# Patient Record
Sex: Female | Born: 1980 | Race: Black or African American | Hispanic: No | Marital: Single | Smoking: Current every day smoker
Health system: Southern US, Community
[De-identification: ages and names within clinical notes are randomized; demographics above are authoritative.]

## PROBLEM LIST (undated history)

## (undated) DIAGNOSIS — D571 Sickle-cell disease without crisis: Secondary | ICD-10-CM

## (undated) HISTORY — PX: CERVIX SURGERY: SHX593

---

## 2016-10-27 ENCOUNTER — Emergency Department
Admission: EM | Admit: 2016-10-27 | Discharge: 2016-10-27 | Disposition: A | Discharge status: Home / Self Care | Payer: Medicaid Other | Attending: Emergency Medicine | Admitting: Emergency Medicine

## 2016-10-27 ENCOUNTER — Encounter: Payer: Self-pay | Admitting: Emergency Medicine

## 2016-10-27 DIAGNOSIS — K0889 Other specified disorders of teeth and supporting structures: Secondary | ICD-10-CM | POA: Insufficient documentation

## 2016-10-27 DIAGNOSIS — F17200 Nicotine dependence, unspecified, uncomplicated: Secondary | ICD-10-CM | POA: Insufficient documentation

## 2016-10-27 DIAGNOSIS — M62838 Other muscle spasm: Secondary | ICD-10-CM | POA: Diagnosis not present

## 2016-10-27 DIAGNOSIS — R200 Anesthesia of skin: Secondary | ICD-10-CM | POA: Diagnosis present

## 2016-10-27 HISTORY — DX: Sickle-cell disease without crisis: D57.1

## 2016-10-27 LAB — BASIC METABOLIC PANEL
Anion gap: 6 (ref 5–15)
BUN: 8 mg/dL (ref 6–20)
CHLORIDE: 110 mmol/L (ref 101–111)
CO2: 25 mmol/L (ref 22–32)
CREATININE: 0.73 mg/dL (ref 0.44–1.00)
Calcium: 9 mg/dL (ref 8.9–10.3)
GFR calc non Af Amer: >60 mL/min (ref >60)
GLUCOSE: 85 mg/dL (ref 65–99)
Potassium: 3.7 mmol/L (ref 3.5–5.1)
Sodium: 141 mmol/L (ref 135–145)

## 2016-10-27 LAB — CBC
HEMATOCRIT: 33.6 % — AB (ref 35.0–47.0)
HEMOGLOBIN: 11.4 g/dL — AB (ref 12.0–16.0)
MCH: 30.1 pg (ref 26.0–34.0)
MCHC: 33.9 g/dL (ref 32.0–36.0)
MCV: 88.9 fL (ref 80.0–100.0)
Platelets: 302 10*3/uL (ref 150–440)
RBC: 3.78 MIL/uL — ABNORMAL LOW (ref 3.80–5.20)
RDW: 14 % (ref 11.5–14.5)
WBC: 9.4 10*3/uL (ref 3.6–11.0)

## 2016-10-27 MED ORDER — AMOXICILLIN-POT CLAVULANATE 875-125 MG PO TABS
1.0000 | ORAL_TABLET | Freq: Once | ORAL | Status: AC
  Administered 2016-10-27: 1 via ORAL
  Filled 2016-10-27: qty 1

## 2016-10-27 MED ORDER — DICLOFENAC SODIUM 3 % TD GEL: 1.0000 "application " | Freq: Two times a day (BID) | TRANSDERMAL | 0 refills | Status: DC | PRN

## 2016-10-27 MED ORDER — CARISOPRODOL 350 MG PO TABS: 350.0000 mg | ORAL_TABLET | Freq: Three times a day (TID) | ORAL | 0 refills | Status: DC | PRN

## 2016-10-27 MED ORDER — AMOXICILLIN-POT CLAVULANATE 875-125 MG PO TABS: 1.0000 | ORAL_TABLET | Freq: Two times a day (BID) | ORAL | 0 refills | Status: AC

## 2016-10-27 NOTE — ED Triage Notes (Addendum)
Pt to ed with c/o numbness in right shoulder radiating up into neck and back of head x 2 days and numbness and swelling to face.  Also c/o bilat feet and leg swelling x 3 days.  Hx of sickle cell anemia.
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
## 2016-10-27 NOTE — ED Notes (Signed)
Pt reports sharp pains in right shoulder shooting to the back of her head as well as left side facial swelling  X 3 days. Pt had recent dental work done and finished amoxicillin this morning.

## 2017-02-04 ENCOUNTER — Emergency Department
Admission: EM | Admit: 2017-02-04 | Discharge: 2017-02-04 | Disposition: A | Discharge status: Home / Self Care | Payer: Medicaid Other | Attending: Emergency Medicine | Admitting: Emergency Medicine

## 2017-02-04 ENCOUNTER — Other Ambulatory Visit: Payer: Self-pay

## 2017-02-04 ENCOUNTER — Emergency Department: Payer: Medicaid Other

## 2017-02-04 DIAGNOSIS — F17200 Nicotine dependence, unspecified, uncomplicated: Secondary | ICD-10-CM | POA: Insufficient documentation

## 2017-02-04 DIAGNOSIS — W19XXXA Unspecified fall, initial encounter: Secondary | ICD-10-CM

## 2017-02-04 DIAGNOSIS — M25531 Pain in right wrist: Secondary | ICD-10-CM | POA: Diagnosis not present

## 2017-02-04 DIAGNOSIS — M25511 Pain in right shoulder: Secondary | ICD-10-CM | POA: Diagnosis not present

## 2017-02-04 MED ORDER — MELOXICAM 15 MG PO TABS: 15.0000 mg | ORAL_TABLET | Freq: Every day | ORAL | 1 refills | Status: DC

## 2017-02-04 NOTE — ED Triage Notes (Signed)
Pt states that she tripped over son's shoes and fell onto her rt side down the stairs, pt is c/o rt sided neck pain and rt arm and wrist pain. Denies loc

## 2017-02-04 NOTE — ED Provider Notes (Signed)
[facility] Emergency Department Provider Note  ____________________________________________  Time seen: Approximately 7:16 PM  I have reviewed the triage vital signs and the nursing notes.   HISTORY  Chief Complaint Fall    HPI Laura Williams is a 36 y.o. female presents to the emergency department with right shoulder pain and right wrist pain that is 10 out of 10 in intensity after she tripped over her son's shoes and fell down stairs.  Patient denies losing consciousness.  She denies weakness, radiculopathy or changes in sensation in the upper extremities.  No skin compromise.  Patient was able to ambulate after the incident.   Past Medical History:  Diagnosis Date  . Sickle cell anemia (HCC)     There are no active problems to display for this patient.   No past surgical history on file.  Prior to Admission medications   Medication Sig Start Date End Date Taking? Authorizing Provider  carisoprodol (SOMA) 350 MG tablet Take 1 tablet (350 mg total) by mouth 3 (three) times daily as needed. 10/27/16   Schaevitz, Myra Rude, MD  Diclofenac Sodium 3 % GEL Place 1 application onto the skin every 12 (twelve) hours as needed. 10/27/16   Myrna Blazer, MD  meloxicam [facility]) 15 MG tablet Take 1 tablet (15 mg total) daily by mouth. 02/04/17 02/04/18  Orvil Feil, PA-C    Allergies Penicillins  No family history on file.  Social History Social History   Tobacco Use  . Smoking status: Current Every Day Smoker  . Smokeless tobacco: Never Used  Substance Use Topics  . Alcohol use: No  . Drug use: No     Review of Systems  Constitutional: No fever/chills Eyes: No visual changes. No discharge ENT: No upper respiratory complaints. Cardiovascular: no chest pain. Respiratory: no cough. No SOB. Gastrointestinal: No abdominal pain.  No nausea, no vomiting.  No diarrhea.  No constipation. Musculoskeletal: Patient has right wrist,  shoulder and neck pain. Skin: Negative for rash, abrasions, lacerations, ecchymosis. Neurological: Negative for headaches, focal weakness or numbness.  ____________________________________________   PHYSICAL EXAM:  VITAL SIGNS: ED Triage Vitals  Enc Vitals Group     BP 02/04/17 1815 111/68     Pulse Rate 02/04/17 1815 65     Resp 02/04/17 1815 18     Temp 02/04/17 1815 98.7 F (37.1 C)     Temp Source 02/04/17 1815 Oral     SpO2 02/04/17 1815 100 %     Weight 02/04/17 1815 162 lb (73.5 kg)     Height 02/04/17 1815 5\' 2"  (1.575 m)     Head Circumference --      Peak Flow --      Pain Score 02/04/17 1814 10     Pain Loc --      Pain Edu? --      Excl. in GC? --      Constitutional: Alert and oriented. Well appearing and in no acute distress. Eyes: Conjunctivae are normal. PERRL. EOMI. Head: Atraumatic. ENT:      Ears: TMs are pearly bilaterally without bloody effusion.      Nose: No congestion/rhinnorhea.      Mouth/Throat: Mucous membranes are moist.  Neck: No stridor.  Patient has cervical spine tenderness to palpation. Cardiovascular: Normal rate, regular rhythm. Normal S1 and S2.  Good peripheral circulation. Respiratory: Normal respiratory effort without tachypnea or retractions. Lungs CTAB. Good air entry to the bases with no decreased or absent breath sounds. Musculoskeletal:  Patient is able to perform limited range of motion at the right shoulder and right wrist, likely secondary to pain.  Patient is able to move all 5 right fingers.  Palpable radial pulse, right. Neurologic:  Normal speech and language. No gross focal neurologic deficits are appreciated.  Skin:  Skin is warm, dry and intact. No rash noted. Psychiatric: Mood and affect are normal. Speech and behavior are normal. Patient exhibits appropriate insight and judgement.   ____________________________________________   LABS (all labs ordered are listed, but only abnormal results are displayed)  Labs  Reviewed - No data to display ____________________________________________  EKG   ____________________________________________  RADIOLOGY Geraldo PitterI, Shade Kaley M Shequita Peplinski, personally viewed and evaluated these images (plain radiographs) as part of my medical decision making, as well as reviewing the written report by the radiologist.    Dg Cervical Spine 2-3 Views  Result Date: 02/04/2017 CLINICAL DATA:  Recent fall with neck pain, initial encounter EXAM: CERVICAL SPINE - 3 VIEW COMPARISON:  None. FINDINGS: Seven cervical segments are well visualized. There is loss the normal cervical lordosis likely related to muscular spasm. Calcification of the stylohyoid ligament is noted bilaterally. No acute fracture is seen. Very mild osteophytic changes are noted at C4-5 and C5-6. No soft tissue changes are noted. The odontoid is within normal limits. IMPRESSION: Mild degenerative change without acute abnormality. Electronically Signed   By: Alcide CleverMark  Lukens M.D.   On: 02/04/2017 19:57   Dg Shoulder Right  Result Date: 02/04/2017 CLINICAL DATA:  Pain after trauma. EXAM: RIGHT SHOULDER - 2+ VIEW COMPARISON:  None. FINDINGS: There is a calcification along the undersurface of the acromion which is favored to be nonacute. This would be an unusual fracture. The study is otherwise normal with no evidence of fracture or dislocation. IMPRESSION: No evidence of acute fracture.  No dislocation. Electronically Signed   By: Gerome Samavid  Williams III M.D   On: 02/04/2017 19:58   Dg Wrist Complete Right  Result Date: 02/04/2017 CLINICAL DATA:  Trip and fall with wrist pain, initial encounter EXAM: RIGHT WRIST - COMPLETE 3+ VIEW COMPARISON:  None. FINDINGS: There is no evidence of fracture or dislocation. There is no evidence of arthropathy or other focal bone abnormality. Soft tissues are unremarkable. IMPRESSION: No acute abnormality noted. Electronically Signed   By: Alcide CleverMark  Lukens M.D.   On: 02/04/2017 19:56     ____________________________________________    PROCEDURES  Procedure(s) performed:    Procedures    Medications - No data to display   ____________________________________________   INITIAL IMPRESSION / ASSESSMENT AND PLAN / ED COURSE  Pertinent labs & imaging results that were available during my care of the patient were reviewed by me and considered in my medical decision making (see chart for details).  Review of the [place] CSRS was performed in accordance of the [facility] prior to dispensing any controlled drugs.    Assessment and plan Right upper extremity pain Patient presents to the emergency department with right shoulder and right wrist pain after a fall.  Differential diagnosis includes contusion versus fracture versus laceration.  No skin compromise is identified on physical exam.  Neurologic exam and overall physical exam is reassuring.  X-ray examination conducted in the emergency department revealed no acute fractures or bony abnormalities.  Patient was discharged with meloxicam and advised to follow-up with orthopedics as needed.  Vital signs are reassuring prior to discharge.  All patient questions were answered.     ____________________________________________  FINAL CLINICAL IMPRESSION(S) / ED DIAGNOSES  Final diagnoses:  Fall, initial encounter      NEW MEDICATIONS STARTED DURING THIS VISIT:  ED Discharge Orders        Ordered    meloxicam (MOBIC) 15 MG tablet  Daily     02/04/17 2030          This chart was dictated using voice recognition software/Dragon. Despite best efforts to proofread, errors can occur which can change the meaning. Any change was purely unintentional.    Gasper LloydWoods, Geoge Lawrance M, PA-C 02/04/17 2117    Sharman CheekStafford, Phillip, MD 02/06/17 [phone]

## 2017-02-04 NOTE — ED Notes (Signed)
Reviewed d/c instructions, follow-up care, prescription, use of ice/elevation with patient. Patient verbalized understanding

## 2017-02-04 NOTE — ED Triage Notes (Signed)
First Nurse Note:  Arrives c/o fall and right wrist injury.  Right wrist with +1 swelling.  + radial pulse and brisk capillary refill noted.  NAD.

## 2017-02-17 ENCOUNTER — Encounter (HOSPITAL_COMMUNITY): Payer: Self-pay | Admitting: *Deleted

## 2017-02-17 ENCOUNTER — Emergency Department (HOSPITAL_COMMUNITY)
Admission: EM | Admit: 2017-02-17 | Discharge: 2017-02-17 | Disposition: A | Discharge status: Home / Self Care | Payer: Medicaid Other | Attending: Emergency Medicine | Admitting: Emergency Medicine

## 2017-02-17 ENCOUNTER — Other Ambulatory Visit: Payer: Self-pay

## 2017-02-17 DIAGNOSIS — F17200 Nicotine dependence, unspecified, uncomplicated: Secondary | ICD-10-CM | POA: Insufficient documentation

## 2017-02-17 DIAGNOSIS — D571 Sickle-cell disease without crisis: Secondary | ICD-10-CM | POA: Insufficient documentation

## 2017-02-17 DIAGNOSIS — K029 Dental caries, unspecified: Secondary | ICD-10-CM | POA: Insufficient documentation

## 2017-02-17 MED ORDER — NAPROXEN 500 MG PO TABS: 500.0000 mg | ORAL_TABLET | Freq: Two times a day (BID) | ORAL | 0 refills | Status: DC

## 2017-02-17 MED ORDER — OXYCODONE-ACETAMINOPHEN 5-325 MG PO TABS
1.0000 | ORAL_TABLET | Freq: Once | ORAL | Status: AC
  Administered 2017-02-17: 1 via ORAL
  Filled 2017-02-17: qty 1

## 2017-02-17 MED ORDER — CLINDAMYCIN HCL 150 MG PO CAPS: 450.0000 mg | ORAL_CAPSULE | Freq: Three times a day (TID) | ORAL | 0 refills | Status: AC

## 2017-02-17 NOTE — Discharge Instructions (Signed)
Please read instructions below. Take the antibiotic, Clindamycin, 3 times per day until they are gone. You can take Naproxen up to 2 times per day with meals, as needed for pain. Schedule an appointment with a dentist, using the dental resource guide attached. Return to the ER for difficulty swallowing or breathing, fever, or new or worsening symptoms.

## 2017-02-17 NOTE — ED Provider Notes (Signed)
[facility]   CSN: [id number] Arrival date & time: 02/17/17  1925     History   Chief Complaint Chief Complaint  Patient presents with  . Dental Problem    HPI Laura Williams is a 36 y.o. female w PMHx sickle cell anemia, presenting to ED with gradually worsening left upper dental pain x2 days. She states she has had some drainage in her mouth from an abscess. Denies difficulty swallowing or breathing, F/C, nausea/vomiting, or other complaints.  States she takes Tylenol and Advil at home for her sickle cell, which has not been providing her with significant relief.  States she currently is an every day smoker.  She has a dentist appointment on the 20th of this month.  The history is provided by the patient.    Past Medical History:  Diagnosis Date  . Sickle cell anemia (HCC)     There are no active problems to display for this patient.   History reviewed. No pertinent surgical history.  OB History    No data available       Home Medications    Prior to Admission medications   Medication Sig Start Date End Date Taking? Authorizing Provider  carisoprodol (SOMA) 350 MG tablet Take 1 tablet (350 mg total) by mouth 3 (three) times daily as needed. 10/27/16   Schaevitz, Myra Rudeavid Matthew, MD  clindamycin (CLEOCIN) 150 MG capsule Take 3 capsules (450 mg total) by mouth 3 (three) times daily for 7 days. 02/17/17 02/24/17  Nirel Babler, [place] N, PA-C  Diclofenac Sodium 3 % GEL Place 1 application onto the skin every 12 (twelve) hours as needed. 10/27/16   Schaevitz, Myra Rudeavid Matthew, MD  meloxicam [facility](MOBIC) 15 MG tablet Take 1 tablet (15 mg total) daily by mouth. 02/04/17 02/04/18  Orvil FeilWoods, Jaclyn M, PA-C  naproxen (NAPROSYN) 500 MG tablet Take 1 tablet (500 mg total) by mouth 2 (two) times daily. 02/17/17   Randall Rampersad, [place] N, PA-C    Family History No family history on file.  Social History Social History   Tobacco Use  . Smoking  status: Current Every Day Smoker  . Smokeless tobacco: Never Used  Substance Use Topics  . Alcohol use: No  . Drug use: No     Allergies   Penicillins   Review of Systems Review of Systems  Constitutional: Negative for chills and fever.  HENT: Positive for dental problem. Negative for trouble swallowing and voice change.   Respiratory: Negative for stridor.   Gastrointestinal: Negative for nausea.     Physical Exam Updated Vital Signs BP 128/85   Pulse 68   Temp 98.7 F (37.1 C) (Oral)   Resp 18   Ht 5\' 2"  (1.575 m)   Wt 73.5 kg (162 lb)   LMP 01/31/2017   SpO2 98%   BMI 29.63 kg/m   Physical Exam  Constitutional: She appears well-developed and well-nourished. No distress.  Tolerating secretions.  HENT:  Head: Normocephalic and atraumatic.  Mouth/Throat: Uvula is midline. No trismus in the jaw. No uvula swelling.    Poor dentition throughout.  Tooth #15 with tenderness, however it is not broken.  Actively draining small fluctuant abscess on the lingual gingiva above tooth 15. No swelling under tongue.  Eyes: Conjunctivae are normal.  Neck: Normal range of motion. Neck supple. No JVD present. No tracheal deviation present.  Cardiovascular: Normal rate and intact distal pulses.  Pulmonary/Chest: Effort normal and breath sounds normal. No stridor.  Lymphadenopathy:  She has no cervical adenopathy.  Psychiatric: She has a normal mood and affect. Her behavior is normal.  Nursing note and vitals reviewed.    ED Treatments / Results  Labs (all labs ordered are listed, but only abnormal results are displayed) Labs Reviewed - No data to display  EKG  EKG Interpretation None       Radiology No results found.  Procedures Procedures (including critical care time)  Medications Ordered in ED Medications  oxyCODONE-acetaminophen (PERCOCET/ROXICET) 5-325 MG per tablet 1 tablet (not administered)     Initial Impression / Assessment and Plan / ED Course    I have reviewed the triage vital signs and the nursing notes.  Pertinent labs & imaging results that were available during my care of the patient were reviewed by me and considered in my medical decision making (see chart for details).    Patient with dental caries and small actively draining abscess.  VSS, afebrile, tolerating secretions. Exam unconcerning for peritonsillar abscess, Ludwig's angina or spread of infection.  Will treat with clindamycin (pt with PCN allergy) and pain medicine.  Urged patient to follow-up with dentist. Pt safe for discharge.  Discussed results, findings, treatment and follow up. Patient advised of return precautions. Patient verbalized understanding and agreed with plan.  Final Clinical Impressions(s) / ED Diagnoses   Final diagnoses:  Pain due to dental caries    ED Discharge Orders        Ordered    clindamycin (CLEOCIN) 150 MG capsule  3 times daily     02/17/17 2039    naproxen (NAPROSYN) 500 MG tablet  2 times daily     02/17/17 2039       Gilda Abboud, [place] N, [place] JerseyPA-C 02/17/17 2040    Loren RacerYelverton, David, MD 02/27/17 [phone]

## 2017-02-17 NOTE — ED Triage Notes (Signed)
The pt is c/o a toothache for one week  lmp last month

## 2017-04-08 ENCOUNTER — Other Ambulatory Visit: Payer: Self-pay

## 2017-04-08 ENCOUNTER — Encounter (HOSPITAL_COMMUNITY): Payer: Self-pay

## 2017-04-08 ENCOUNTER — Emergency Department (HOSPITAL_COMMUNITY)
Admission: EM | Admit: 2017-04-08 | Discharge: 2017-04-08 | Disposition: A | Discharge status: Home / Self Care | Payer: Medicaid Other | Attending: Emergency Medicine | Admitting: Emergency Medicine

## 2017-04-08 DIAGNOSIS — F17210 Nicotine dependence, cigarettes, uncomplicated: Secondary | ICD-10-CM | POA: Insufficient documentation

## 2017-04-08 DIAGNOSIS — Z79899 Other long term (current) drug therapy: Secondary | ICD-10-CM | POA: Insufficient documentation

## 2017-04-08 DIAGNOSIS — D571 Sickle-cell disease without crisis: Secondary | ICD-10-CM | POA: Insufficient documentation

## 2017-04-08 DIAGNOSIS — K047 Periapical abscess without sinus: Secondary | ICD-10-CM | POA: Insufficient documentation

## 2017-04-08 MED ORDER — CLINDAMYCIN HCL 300 MG PO CAPS: 300.0000 mg | ORAL_CAPSULE | Freq: Three times a day (TID) | ORAL | 0 refills | Status: AC

## 2017-04-08 MED ORDER — LIDOCAINE VISCOUS 2 % MT SOLN: 20.0000 mL | OROMUCOSAL | 0 refills | Status: DC | PRN

## 2017-04-08 NOTE — Discharge Instructions (Signed)
Take antibiotics as prescribed.  Take the entire course of antibiotics, even if your symptoms improve. Continue using Tylenol or ibuprofen as needed for pain.  You may take ibuprofen up to 3 times a day. Use viscous lidocaine as needed for pain. It is important that you follow-up with a dentist.  You may follow-up with a dentist who you saw last month, or there is information about other dentists in the area attached in the paperwork. Return to the emergency room if you develop fevers, difficulty breathing, difficulty swallowing, or any new or concerning symptoms.

## 2017-04-08 NOTE — ED Provider Notes (Signed)
[facility]-EMERGENCY DEPT Provider Note   CSN: [id number] Arrival date & time: 04/08/17  2041     History   Chief Complaint Chief Complaint  Patient presents with  . Dental Pain    HPI Laura Williams is a 37 y.o. female presenting for evaluation of dental pain.  Patient states that for the past 2 days, she has had dental pain.  She has had no improvement with Tylenol or ibuprofen.  She has constant tooth pain with radiation towards her ear.  Nothing makes it better.  She thinks this began after part of her tooth got chipped off while she was eating.  She states this is a different tooth than was infected last month, as she got that one pulled with a dentist last month.  She denies fevers, chills, difficulty swallowing, or difficulty handling secretions.  She has a history of sickle cell, no other medical problems.  HPI  Past Medical History:  Diagnosis Date  . Sickle cell anemia (HCC)     There are no active problems to display for this patient.   History reviewed. No pertinent surgical history.  OB History    No data available       Home Medications    Prior to Admission medications   Medication Sig Start Date End Date Taking? Authorizing Provider  carisoprodol (SOMA) 350 MG tablet Take 1 tablet (350 mg total) by mouth 3 (three) times daily as needed. 10/27/16   Schaevitz, Myra Rude, MD  clindamycin (CLEOCIN) 300 MG capsule Take 1 capsule (300 mg total) by mouth 3 (three) times daily for 7 days. 04/08/17 04/15/17  Franceska Strahm, PA-C  Diclofenac Sodium 3 % GEL Place 1 application onto the skin every 12 (twelve) hours as needed. 10/27/16   Schaevitz, Myra Rude, MD  lidocaine (XYLOCAINE) 2 % solution Use as directed 20 mLs in the mouth or throat as needed for mouth pain. 04/08/17   Reyden Villers, PA-C  meloxicam (MOBIC) 15 MG tablet Take 1 tablet (15 mg total) daily by mouth. 02/04/17 02/04/18  Orvil Feil, PA-C  naproxen (NAPROSYN)  500 MG tablet Take 1 tablet (500 mg total) by mouth 2 (two) times daily. 02/17/17   Robinson, [place] N, PA-C    Family History History reviewed. No pertinent family history.  Social History Social History   Tobacco Use  . Smoking status: Current Every Day Smoker    Packs/day: 1.00  . Smokeless tobacco: Never Used  Substance Use Topics  . Alcohol use: No  . Drug use: No     Allergies   Penicillins   Review of Systems Review of Systems  Constitutional: Negative for chills and fever.  HENT: Positive for dental problem.      Physical Exam Updated Vital Signs BP 119/70 (BP Location: Right Arm)   Pulse 69   Temp 98.1 F (36.7 C) (Oral)   Resp 16   Ht 5\' 2"  (1.575 m)   Wt 80.7 kg (178 lb)   LMP 04/07/2017   SpO2 99%   BMI 32.56 kg/m   Physical Exam  Constitutional: She is oriented to person, place, and time. She appears well-developed and well-nourished. No distress.  HENT:  Head: Normocephalic and atraumatic.  Right Ear: Tympanic membrane, external ear and ear canal normal.  Left Ear: Tympanic membrane, external ear and ear canal normal.  Nose: Nose normal.  Mouth/Throat: Oropharynx is clear and moist and mucous membranes are normal. No trismus in the jaw. Abnormal dentition. Dental caries  present. No dental abscesses or uvula swelling.    TTP of left upper molars with surrounding gum erythema and edema.  No obvious dental abscess.  Uvula midline with equal palate rise.  No pain under the tongue.  No trismus.  Patient handling secretions easily.  Eyes: EOM are normal.  Neck: Normal range of motion.  Pulmonary/Chest: Effort normal.  Abdominal: She exhibits no distension.  Musculoskeletal: Normal range of motion.  Neurological: She is alert and oriented to person, place, and time.  Skin: Skin is warm. No rash noted.  Psychiatric: She has a normal mood and affect.  Nursing note and vitals reviewed.    ED Treatments / Results  Labs (all labs ordered are  listed, but only abnormal results are displayed) Labs Reviewed - No data to display  EKG  EKG Interpretation None       Radiology No results found.  Procedures Procedures (including critical care time)  Medications Ordered in ED Medications - No data to display   Initial Impression / Assessment and Plan / ED Course  I have reviewed the triage vital signs and the nursing notes.  Pertinent labs & imaging results that were available during my care of the patient were reviewed by me and considered in my medical decision making (see chart for details).     Patient presenting for evaluation of left upper tooth pain.  Physical exam shows patient who is afebrile not tachycardic.  She appears nontoxic.  She has tenderness, gingival erythema, and gingival edema of the left upper molars.  No sign of Ludwig's.  Likely infection without abscess.  Will treat with clindamycin, as patient is allergic to penicillins.  Ibuprofen, Tylenol, viscous lidocaine as needed for pain.  Patient to follow-up with dentistry.  At this time, patient appears safe for discharge.  Return precautions given.  Patient states she understands and agrees to plan.   Final Clinical Impressions(s) / ED Diagnoses   Final diagnoses:  Dental infection    ED Discharge Orders        Ordered    clindamycin (CLEOCIN) 300 MG capsule  3 times daily     04/08/17 2121    lidocaine (XYLOCAINE) 2 % solution  As needed     04/08/17 2121       Alveria ApleyCaccavale, Leiyah Maultsby, PA-C 04/08/17 2158    Shaune PollackIsaacs, Cameron, MD 04/09/17 1126

## 2017-04-08 NOTE — ED Triage Notes (Addendum)
Pt arrives today c/o a tooth ache x2 days. Pt states she was eating when she chipped a tooth upper left side. Pt denies troubles swallowing. As been taking tylenol/ibuprofen with minimal relief.

## 2017-09-21 ENCOUNTER — Other Ambulatory Visit: Payer: Self-pay

## 2017-09-21 ENCOUNTER — Emergency Department
Admission: EM | Admit: 2017-09-21 | Discharge: 2017-09-21 | Disposition: A | Discharge status: Home / Self Care | Payer: Self-pay | Attending: Emergency Medicine | Admitting: Emergency Medicine

## 2017-09-21 ENCOUNTER — Encounter: Payer: Self-pay | Admitting: Emergency Medicine

## 2017-09-21 ENCOUNTER — Emergency Department: Payer: Self-pay

## 2017-09-21 DIAGNOSIS — Y999 Unspecified external cause status: Secondary | ICD-10-CM | POA: Insufficient documentation

## 2017-09-21 DIAGNOSIS — Y9389 Activity, other specified: Secondary | ICD-10-CM | POA: Insufficient documentation

## 2017-09-21 DIAGNOSIS — S51802A Unspecified open wound of left forearm, initial encounter: Secondary | ICD-10-CM | POA: Insufficient documentation

## 2017-09-21 DIAGNOSIS — W540XXA Bitten by dog, initial encounter: Secondary | ICD-10-CM | POA: Insufficient documentation

## 2017-09-21 DIAGNOSIS — Y9289 Other specified places as the place of occurrence of the external cause: Secondary | ICD-10-CM | POA: Insufficient documentation

## 2017-09-21 DIAGNOSIS — Z23 Encounter for immunization: Secondary | ICD-10-CM | POA: Insufficient documentation

## 2017-09-21 DIAGNOSIS — F17210 Nicotine dependence, cigarettes, uncomplicated: Secondary | ICD-10-CM | POA: Insufficient documentation

## 2017-09-21 MED ORDER — BACITRACIN-NEOMYCIN-POLYMYXIN 400-5-5000 EX OINT
TOPICAL_OINTMENT | CUTANEOUS | Status: AC
  Filled 2017-09-21: qty 1

## 2017-09-21 MED ORDER — AMOXICILLIN-POT CLAVULANATE 875-125 MG PO TABS: 1.0000 | ORAL_TABLET | Freq: Two times a day (BID) | ORAL | 0 refills | Status: AC

## 2017-09-21 MED ORDER — TETANUS-DIPHTH-ACELL PERTUSSIS 5-2.5-18.5 LF-MCG/0.5 IM SUSP
0.5000 mL | Freq: Once | INTRAMUSCULAR | Status: AC
  Administered 2017-09-21: 0.5 mL via INTRAMUSCULAR
  Filled 2017-09-21: qty 0.5

## 2017-09-21 MED ORDER — BACITRACIN-NEOMYCIN-POLYMYXIN 400-5-5000 EX OINT
TOPICAL_OINTMENT | Freq: Once | CUTANEOUS | Status: AC
  Administered 2017-09-21: 03:00:00 via TOPICAL
  Filled 2017-09-21: qty 1

## 2017-09-21 MED ORDER — OXYCODONE-ACETAMINOPHEN 5-325 MG PO TABS
1.0000 | ORAL_TABLET | Freq: Once | ORAL | Status: AC
  Administered 2017-09-21: 1 via ORAL
  Filled 2017-09-21: qty 1

## 2017-09-21 MED ORDER — AMOXICILLIN-POT CLAVULANATE 875-125 MG PO TABS
1.0000 | ORAL_TABLET | Freq: Once | ORAL | Status: AC
  Administered 2017-09-21: 1 via ORAL
  Filled 2017-09-21: qty 1

## 2017-09-21 MED ORDER — OXYCODONE-ACETAMINOPHEN 5-325 MG PO TABS: 1.0000 | ORAL_TABLET | ORAL | 0 refills | Status: AC | PRN

## 2017-09-21 NOTE — ED Provider Notes (Signed)
[facility] Emergency Department Provider Note   ____________________________________________   First MD Initiated Contact with Patient 09/21/17 [phone]     (approximate)  I have reviewed the triage vital signs and the nursing notes.   HISTORY  Chief Complaint Animal Bite    HPI Laura Williams is a 37 y.o. female who says he was by the dog when it bit her.  She complains a lot of pain in the wrist.  There is some swelling there and 3 puncture wounds on the palmar surface of the forearm.  This is down toward the wrist.  Patient has normal movement and strength in the fingers although it hurts to exert herself and complains of slight numbness in all 4 of the fingers but not the thumb.      Past Medical History:  Diagnosis Date  . Sickle cell anemia (HCC)     There are no active problems to display for this patient.   History reviewed. No pertinent surgical history.  Prior to Admission medications   Medication Sig Start Date End Date Taking? Authorizing Provider  amoxicillin-clavulanate (AUGMENTIN) 875-125 MG tablet Take 1 tablet by mouth 2 (two) times daily for 10 days. 09/21/17 10/01/17  Arnaldo Natal, MD  carisoprodol (SOMA) 350 MG tablet Take 1 tablet (350 mg total) by mouth 3 (three) times daily as needed. 10/27/16   Schaevitz, Myra Rude, MD  Diclofenac Sodium 3 % GEL Place 1 application onto the skin every 12 (twelve) hours as needed. 10/27/16   Schaevitz, Myra Rude, MD  lidocaine (XYLOCAINE) 2 % solution Use as directed 20 mLs in the mouth or throat as needed for mouth pain. 04/08/17   Caccavale, Sophia, PA-C  meloxicam (MOBIC) 15 MG tablet Take 1 tablet (15 mg total) daily by mouth. 02/04/17 02/04/18  Orvil Feil, PA-C  naproxen (NAPROSYN) 500 MG tablet Take 1 tablet (500 mg total) by mouth 2 (two) times daily. 02/17/17   Robinson, [place] N, PA-C  oxyCODONE-acetaminophen (PERCOCET) 5-325 MG tablet Take 1 tablet by mouth every 4 (four)  hours as needed for severe pain. 09/21/17 09/21/18  Arnaldo Natal, MD    Allergies Penicillins  No family history on file.  Social History Social History   Tobacco Use  . Smoking status: Current Every Day Smoker    Packs/day: 1.00  . Smokeless tobacco: Never Used  Substance Use Topics  . Alcohol use: No  . Drug use: No    Review of Systems  Constitutional: No fever/chills Eyes: No visual changes. ENT: No sore throat. Cardiovascular: Denies chest pain. Respiratory: Denies shortness of breath. Gastrointestinal: No abdominal pain.  No nausea, no vomiting.  No diarrhea.  No constipation. Genitourinary: Negative for dysuria. Musculoskeletal: Negative for back pain. Skin: Negative for rash. Neurological: Negative for headaches, focal weakness    ____________________________________________   PHYSICAL EXAM:  VITAL SIGNS: ED Triage Vitals [09/21/17 0205]  Enc Vitals Group     BP (!) 142/87     Pulse Rate 86     Resp 18     Temp 98.2 F (36.8 C)     Temp Source Oral     SpO2 100 %     Weight 180 lb (81.6 kg)     Height 5\' 2"  (1.575 m)     Head Circumference      Peak Flow      Pain Score 10     Pain Loc      Pain Edu?  Excl. in GC?     Constitutional: Alert and oriented. Well appearing and in no acute distress. Eyes: Conjunctivae are normal.  Head: Atraumatic. Nose: No congestion/rhinnorhea. Mouth/Throat: Mucous membranes are moist.  Oropharynx non-erythematous. Neck: No stridor.   Cardiovascular: Normal rate, regular rhythm. Peri Jefferson peripheral circulation. Respiratory: Normal respiratory effort.  No retractions. Gastrointestinal: Soft and nontender. No distention. No abdominal bruits. No CVA tenderness. Musculoskeletal: No lower extremity tenderness nor edema.  s. Neurologic:  Normal speech and language.  Patient has numbness and tingling in the fingers is not very numb she says just a little tingly but two-point discrimination in the fingertips is not  good.  She has trouble distinguishing between the tip of my pen in the open tips of the mosquitoes that I used.  She has 2 puncture wounds over the wrist on the volar surface of the base of the thumb and one under the ulnar side volar surface. Skin:  Skin is warm, dry and intact. No rash noted. Psychiatric: Mood and affect are normal. Speech and behavior are normal.  ____________________________________________   LABS (all labs ordered are listed, but only abnormal results are displayed)  Labs Reviewed - No data to display ____________________________________________  EKG   ____________________________________________  RADIOLOGY  ED MD interpretation: Chere read by radiology reviewed by me shows no fractures or foreign bodies  Official radiology report(s): Dg Forearm Left  Result Date: 09/21/2017 CLINICAL DATA:  49 y/o  F; dog bite of the left wrist. EXAM: LEFT FOREARM - 2 VIEW COMPARISON:  None. FINDINGS: There is no evidence of fracture or other focal bone lesions. Soft tissue swelling of the anterior distal forearm. No radiopaque foreign body identified. IMPRESSION: 1.  No acute fracture or dislocation identified. 2. Soft tissue swelling of the anterior distal forearm. No radiopaque foreign body identified. Electronically Signed   By: Mitzi Hansen M.D.   On: 09/21/2017 02:53    ____________________________________________   PROCEDURES  Procedure(s) performed: Wounds were cleaned by the nurse with irrigation.  I examined the patient again after the irrigation.  There was a small globule of fat sticking out of the one of the wounds.  I grasped this with forceps and pulled it up and cut it off.  This did not cause any pain for the patient.  I reclean the wound afterwards.  Patient has very poor two-point discrimination in the fingertips and diffuse numbness in the fingertips although patient reports is not very numb just a little.  She has good range of motion and strength  in the fingers though both flexion and extension.  I discussed the patient with Dr. Allena Katz he feels it is okay to follow her up in the office with this.  The wounds were not closed and he agrees with this.  We will give her some Augmentin.  She can take on amoxicillin but she has hives with penicillin.  Last thing she took was on amoxicillin.  Procedures  Critical Care performed:   ____________________________________________   INITIAL IMPRESSION / ASSESSMENT AND PLAN / ED COURSE           ____________________________________________   FINAL CLINICAL IMPRESSION(S) / ED DIAGNOSES  Final diagnoses:  Dog bite, initial encounter     ED Discharge Orders        Ordered    oxyCODONE-acetaminophen (PERCOCET) 5-325 MG tablet  Every 4 hours PRN     09/21/17 0430    amoxicillin-clavulanate (AUGMENTIN) 875-125 MG tablet  2 times daily  09/21/17 0430       Note:  This document was prepared using Dragon voice recognition software and may include unintentional dictation errors.    Arnaldo NatalMalinda, Anel Creighton F, MD 09/21/17 0430

## 2017-09-21 NOTE — Discharge Instructions (Signed)
Take the Augmentin 1 pill twice a day that the antibiotic the should help prevent infection.  Use the Percocet if needed 1 pill 4 times a day.  Please return here for worse pain, redness, swelling or increasing numbness.  Please call Dr. Allena KatzPatel the orthopedic surgeon and follow-up with him in his office this coming week.  Can use Motrin or Tylenol for pain or if she has severe pain you can take the Percocet 1 pill 4 times a day.  Do not take Tylenol with the Percocet because there is Tylenol in the Percocet you can give Tylenol overdose which is potentially serious.  Be careful the Percocet can make you woozy and constipated do not drive on the Percocet.

## 2017-09-21 NOTE — ED Notes (Addendum)
[place]ified [place] [facility] [phone], [facility]

## 2017-09-21 NOTE — ED Notes (Addendum)
3 punctures to left wrist wound clensed with saline; neosporin & gauze dressing applied

## 2017-09-21 NOTE — ED Triage Notes (Signed)
Dog bite , left wrist , three puncture wounds, [place] . Sensation /movement WNL distal to injury

## 2017-11-19 ENCOUNTER — Emergency Department (HOSPITAL_COMMUNITY): Payer: Self-pay

## 2017-11-19 ENCOUNTER — Encounter (HOSPITAL_COMMUNITY): Payer: Self-pay | Admitting: Emergency Medicine

## 2017-11-19 ENCOUNTER — Emergency Department (HOSPITAL_COMMUNITY)
Admission: EM | Admit: 2017-11-19 | Discharge: 2017-11-19 | Disposition: A | Discharge status: Home / Self Care | Payer: Self-pay | Attending: Emergency Medicine | Admitting: Emergency Medicine

## 2017-11-19 ENCOUNTER — Other Ambulatory Visit: Payer: Self-pay

## 2017-11-19 DIAGNOSIS — M67431 Ganglion, right wrist: Secondary | ICD-10-CM | POA: Insufficient documentation

## 2017-11-19 DIAGNOSIS — Y929 Unspecified place or not applicable: Secondary | ICD-10-CM | POA: Insufficient documentation

## 2017-11-19 DIAGNOSIS — Y999 Unspecified external cause status: Secondary | ICD-10-CM | POA: Insufficient documentation

## 2017-11-19 DIAGNOSIS — W010XXA Fall on same level from slipping, tripping and stumbling without subsequent striking against object, initial encounter: Secondary | ICD-10-CM | POA: Insufficient documentation

## 2017-11-19 DIAGNOSIS — Z79899 Other long term (current) drug therapy: Secondary | ICD-10-CM | POA: Insufficient documentation

## 2017-11-19 DIAGNOSIS — F17210 Nicotine dependence, cigarettes, uncomplicated: Secondary | ICD-10-CM | POA: Insufficient documentation

## 2017-11-19 DIAGNOSIS — Y939 Activity, unspecified: Secondary | ICD-10-CM | POA: Insufficient documentation

## 2017-11-19 MED ORDER — MELOXICAM 15 MG PO TABS: 15.0000 mg | ORAL_TABLET | Freq: Every day | ORAL | 0 refills | Status: DC

## 2017-11-19 NOTE — ED Triage Notes (Signed)
Patient tripped and injured her right wrist last week with mild swelling and pain radiating to right forearm .

## 2017-11-19 NOTE — Discharge Instructions (Addendum)
Wear your splint on your wrist at all times unless you are bathing or showering. Ice your wrist 3-5 times daily for 20 minutes at a time with a bag of ice and a towel between the ice and your skin. Follow-up closely with Dr. Amanda Pea.  Follow these instructions at home: Do not press on the ganglion cyst, poke it with a needle, or hit it. Take medicines only as directed by your health care provider. Wear your brace or splint as directed by your health care provider. Watch your ganglion cyst for any changes. Keep all follow-up visits as directed by your health care provider. This is important. Contact a health care provider if: Your ganglion cyst becomes larger or more painful. You have increased redness, red streaks, or swelling. You have pus coming from the lump. You have weakness or numbness in the affected area. You have a fever or chills.

## 2017-11-19 NOTE — ED Notes (Signed)
Patient verbalizes understanding of discharge instructions. Opportunity for questioning and answers were provided. Armband removed by staff, pt discharged from ED ambulatory.   

## 2017-11-19 NOTE — ED Provider Notes (Signed)
[facility] EMERGENCY DEPARTMENT Provider Note   CSN: [id number] Arrival date & time: 11/19/17  2125     History   Chief Complaint Chief Complaint  Patient presents with  . Wrist Injury    HPI Laura Williams is a 37 y.o. female who sustained a right wrist injury 1 week(s) ago. Mechanism of injury: Patient tripped and sustained FOOSH.  She has small bump on her wrist that had been near that got bigger and is not tender on the dorsal surface of her wrist.  Immediate symptoms: Minor pain in the wrist however it has gotten progressively more tender and swollen.  She still able to use the hand.  She is tried Motrin and Tylenol without significant relief.  Symptoms have been stepwise since that time. Prior history of related problems: no prior problems with this area in the past.    HPI  Past Medical History:  Diagnosis Date  . Sickle cell anemia (HCC)     There are no active problems to display for this patient.   Past Surgical History:  Procedure Laterality Date  . CERVIX SURGERY       OB History   None      Home Medications    Prior to Admission medications   Medication Sig Start Date End Date Taking? Authorizing Provider  carisoprodol (SOMA) 350 MG tablet Take 1 tablet (350 mg total) by mouth 3 (three) times daily as needed. 10/27/16   Schaevitz, Myra Rude, MD  Diclofenac Sodium 3 % GEL Place 1 application onto the skin every 12 (twelve) hours as needed. 10/27/16   Schaevitz, Myra Rude, MD  lidocaine (XYLOCAINE) 2 % solution Use as directed 20 mLs in the mouth or throat as needed for mouth pain. 04/08/17   Caccavale, Sophia, PA-C  meloxicam (MOBIC) 15 MG tablet Take 1 tablet (15 mg total) daily by mouth. 02/04/17 02/04/18  Orvil Feil, PA-C  naproxen (NAPROSYN) 500 MG tablet Take 1 tablet (500 mg total) by mouth 2 (two) times daily. 02/17/17   Robinson, [place] N, PA-C  oxyCODONE-acetaminophen (PERCOCET) 5-325 MG tablet Take 1 tablet by mouth  every 4 (four) hours as needed for severe pain. 09/21/17 09/21/18  Arnaldo Natal, MD    Family History No family history on file.  Social History Social History   Tobacco Use  . Smoking status: Current Every Day Smoker    Packs/day: 1.00  . Smokeless tobacco: Never Used  Substance Use Topics  . Alcohol use: No  . Drug use: No     Allergies   Penicillins   Review of Systems Review of Systems  Musculoskeletal: Positive for joint swelling.  Neurological: Negative for weakness and numbness.    Ten systems reviewed and are negative for acute change, except as noted in the HPI.   Physical Exam Updated Vital Signs BP 131/83 (BP Location: Right Arm)   Pulse 73   Temp 98.7 F (37.1 C) (Oral)   Resp 16   LMP 11/03/2017   SpO2 100%   Physical Exam  Constitutional: She is oriented to person, place, and time. She appears well-developed and well-nourished. No distress.  HENT:  Head: Normocephalic and atraumatic.  Eyes: Conjunctivae are normal. No scleral icterus.  Neck: Normal range of motion.  Cardiovascular: Normal rate, regular rhythm and normal heart sounds. Exam reveals no gallop and no friction rub.  No murmur heard. Pulmonary/Chest: Effort normal and breath sounds normal. No respiratory distress.  Abdominal: Soft. Bowel sounds are normal.  She exhibits no distension and no mass. There is no tenderness. There is no guarding.  Musculoskeletal:  Tender, mobile, tense but fluctuant 2 cm circular and well-circumscribed mass on the dorsum of the right wrist.  Full range of motion, full range of strength of the wrist and hand.  Tenderness with passive and active extension of the wrist.  Neurological: She is alert and oriented to person, place, and time.  Skin: Skin is warm and dry. She is not diaphoretic.  Psychiatric: Her behavior is normal.  Nursing note and vitals reviewed.    ED Treatments / Results  Labs (all labs ordered are listed, but only abnormal results are  displayed) Labs Reviewed - No data to display  EKG None  Radiology No results found.  Procedures Procedures (including critical care time)  Medications Ordered in ED Medications - No data to display   Initial Impression / Assessment and Plan / ED Course  I have reviewed the triage vital signs and the nursing notes.  Pertinent labs & imaging results that were available during my care of the patient were reviewed by me and considered in my medical decision making (see chart for details).    Patient x-ray negative.  I personally reviewed x-ray films and do not see any acute abnormalities and agree with the radiologic interpretation.  The patient appears to have a ganglion that was present but has worsened a bit more inflamed and tender.  She was placed in a wrist splint with supportive care-anti-inflammatories.  Follow-up with hand specialist.   Final Clinical Impressions(s) / ED Diagnoses   Final diagnoses:  None    ED Discharge Orders    None       Arthor Captain, PA-C 11/20/17 0107    Pricilla Loveless, MD 11/23/17 2325

## 2018-04-29 ENCOUNTER — Other Ambulatory Visit: Payer: Self-pay

## 2018-04-29 ENCOUNTER — Encounter (HOSPITAL_COMMUNITY): Payer: Self-pay | Admitting: Emergency Medicine

## 2018-04-29 ENCOUNTER — Emergency Department (HOSPITAL_COMMUNITY)
Admission: EM | Admit: 2018-04-29 | Discharge: 2018-04-30 | Disposition: A | Discharge status: Home / Self Care | Payer: No Typology Code available for payment source | Attending: Emergency Medicine | Admitting: Emergency Medicine

## 2018-04-29 DIAGNOSIS — Z79899 Other long term (current) drug therapy: Secondary | ICD-10-CM | POA: Insufficient documentation

## 2018-04-29 DIAGNOSIS — M25552 Pain in left hip: Secondary | ICD-10-CM | POA: Diagnosis not present

## 2018-04-29 DIAGNOSIS — R0789 Other chest pain: Secondary | ICD-10-CM | POA: Insufficient documentation

## 2018-04-29 DIAGNOSIS — F17200 Nicotine dependence, unspecified, uncomplicated: Secondary | ICD-10-CM | POA: Insufficient documentation

## 2018-04-29 DIAGNOSIS — Y92410 Unspecified street and highway as the place of occurrence of the external cause: Secondary | ICD-10-CM | POA: Insufficient documentation

## 2018-04-29 DIAGNOSIS — M545 Low back pain: Secondary | ICD-10-CM | POA: Insufficient documentation

## 2018-04-29 DIAGNOSIS — Y939 Activity, unspecified: Secondary | ICD-10-CM | POA: Diagnosis not present

## 2018-04-29 DIAGNOSIS — Y998 Other external cause status: Secondary | ICD-10-CM | POA: Diagnosis not present
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
## 2018-04-30 ENCOUNTER — Emergency Department (HOSPITAL_COMMUNITY): Payer: No Typology Code available for payment source

## 2018-04-30 LAB — POC URINE PREG, ED: Preg Test, Ur: NEGATIVE

## 2018-04-30 MED ORDER — METHOCARBAMOL 500 MG PO TABS: 500.0000 mg | ORAL_TABLET | Freq: Three times a day (TID) | ORAL | 0 refills | Status: DC | PRN

## 2018-04-30 MED ORDER — ONDANSETRON 4 MG PO TBDP: 4.0000 mg | ORAL_TABLET | Freq: Four times a day (QID) | ORAL | 0 refills | Status: DC | PRN

## 2018-04-30 MED ORDER — HYDROCODONE-ACETAMINOPHEN 5-325 MG PO TABS
2.0000 | ORAL_TABLET | Freq: Once | ORAL | Status: AC
  Administered 2018-04-30: 2 via ORAL
  Filled 2018-04-30: qty 2

## 2018-04-30 MED ORDER — ONDANSETRON 4 MG PO TBDP
4.0000 mg | ORAL_TABLET | Freq: Once | ORAL | Status: AC
  Administered 2018-04-30: 4 mg via ORAL
  Filled 2018-04-30: qty 1

## 2018-04-30 NOTE — Discharge Instructions (Addendum)
You may alternate Tylenol 1000 mg every 6 hours as needed for pain and Ibuprofen 800 mg every 8 hours as needed for pain.  Please take Ibuprofen with food.  Your x-rays of your ribs, lower back and hip were normal today.
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
## 2021-04-30 ENCOUNTER — Emergency Department
Admission: EM | Admit: 2021-04-30 | Discharge: 2021-04-30 | Disposition: A | Discharge status: Home / Self Care | Payer: Medicaid Other | Attending: Emergency Medicine | Admitting: Emergency Medicine

## 2021-04-30 ENCOUNTER — Emergency Department: Payer: Medicaid Other

## 2021-04-30 ENCOUNTER — Other Ambulatory Visit: Payer: Self-pay

## 2021-04-30 DIAGNOSIS — M545 Low back pain, unspecified: Secondary | ICD-10-CM | POA: Diagnosis present

## 2021-04-30 DIAGNOSIS — T148XXA Other injury of unspecified body region, initial encounter: Secondary | ICD-10-CM

## 2021-04-30 DIAGNOSIS — M25552 Pain in left hip: Secondary | ICD-10-CM | POA: Diagnosis not present

## 2021-04-30 DIAGNOSIS — Y92410 Unspecified street and highway as the place of occurrence of the external cause: Secondary | ICD-10-CM | POA: Diagnosis not present

## 2021-04-30 LAB — URINALYSIS, ROUTINE W REFLEX MICROSCOPIC
Bilirubin Urine: NEGATIVE
Glucose, UA: NEGATIVE mg/dL
Hgb urine dipstick: NEGATIVE
Ketones, ur: NEGATIVE mg/dL
Leukocytes,Ua: NEGATIVE
Nitrite: NEGATIVE
Protein, ur: NEGATIVE mg/dL
Specific Gravity, Urine: 1.013 (ref 1.005–1.030)
pH: 8 (ref 5.0–8.0)

## 2021-04-30 LAB — POC URINE PREG, ED: Preg Test, Ur: NEGATIVE

## 2021-04-30 MED ORDER — HYDROCODONE-ACETAMINOPHEN 5-325 MG PO TABS: 1.0000 | ORAL_TABLET | Freq: Four times a day (QID) | ORAL | 0 refills | Status: AC | PRN

## 2021-04-30 MED ORDER — METHOCARBAMOL 500 MG PO TABS: ORAL_TABLET | ORAL | 0 refills | Status: AC

## 2021-04-30 NOTE — Discharge Instructions (Addendum)
Follow-up with your primary care provider or urgent care if any continued problems.  Begin taking medication only as directed.  These medications could cause drowsiness and increase your risk for injury.  Do not drive or operate machinery while taking this medication.  You may use ice or heat to your back and hip area as needed.

## 2021-04-30 NOTE — ED Notes (Signed)
Patient provided with water and asked to provide urine specimen. Unable to pee at this time.

## 2021-04-30 NOTE — ED Notes (Signed)
Pt. Very upset at wait time, states she will never be coming back to this hospital, and wants discharge papers and work note and to leave right now. Pt. Speaking loudly, rapidly, and with profanity. This RN provided pt with discharge papers and work note, and explained persription. Discharge VS not obtained r/t pt's demeanor, verbal aggression and urgency to leave.

## 2021-04-30 NOTE — ED Notes (Signed)
Pt ambulating in hall, gait steady
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
## 2021-08-26 ENCOUNTER — Encounter: Payer: Self-pay | Admitting: Emergency Medicine

## 2021-08-26 ENCOUNTER — Emergency Department
Admission: EM | Admit: 2021-08-26 | Discharge: 2021-08-26 | Disposition: A | Discharge status: Home / Self Care | Payer: Medicaid Other | Attending: Emergency Medicine | Admitting: Emergency Medicine

## 2021-08-26 ENCOUNTER — Other Ambulatory Visit: Payer: Self-pay

## 2021-08-26 DIAGNOSIS — N920 Excessive and frequent menstruation with regular cycle: Secondary | ICD-10-CM | POA: Diagnosis not present

## 2021-08-26 DIAGNOSIS — D649 Anemia, unspecified: Secondary | ICD-10-CM | POA: Insufficient documentation

## 2021-08-26 DIAGNOSIS — R112 Nausea with vomiting, unspecified: Secondary | ICD-10-CM | POA: Diagnosis not present

## 2021-08-26 DIAGNOSIS — R197 Diarrhea, unspecified: Secondary | ICD-10-CM | POA: Diagnosis not present

## 2021-08-26 DIAGNOSIS — R109 Unspecified abdominal pain: Secondary | ICD-10-CM | POA: Diagnosis present

## 2021-08-26 DIAGNOSIS — R1084 Generalized abdominal pain: Secondary | ICD-10-CM

## 2021-08-26 LAB — COMPREHENSIVE METABOLIC PANEL
ALT: 9 U/L (ref 0–44)
AST: 16 U/L (ref 15–41)
Albumin: 3.5 g/dL (ref 3.5–5.0)
Alkaline Phosphatase: 32 U/L — ABNORMAL LOW (ref 38–126)
Anion gap: 5 (ref 5–15)
BUN: 6 mg/dL (ref 6–20)
CO2: 23 mmol/L (ref 22–32)
Calcium: 8.7 mg/dL — ABNORMAL LOW (ref 8.9–10.3)
Chloride: 110 mmol/L (ref 98–111)
Creatinine, Ser: 0.52 mg/dL (ref 0.44–1.00)
GFR, Estimated: >60 mL/min (ref >60)
Glucose, Bld: 110 mg/dL — ABNORMAL HIGH (ref 70–99)
Potassium: 3.6 mmol/L (ref 3.5–5.1)
Sodium: 138 mmol/L (ref 135–145)
Total Bilirubin: 0.4 mg/dL (ref 0.3–1.2)
Total Protein: 6.6 g/dL (ref 6.5–8.1)

## 2021-08-26 LAB — URINALYSIS, ROUTINE W REFLEX MICROSCOPIC
Bilirubin Urine: NEGATIVE
Glucose, UA: NEGATIVE mg/dL
Hgb urine dipstick: NEGATIVE
Ketones, ur: NEGATIVE mg/dL
Nitrite: NEGATIVE
Protein, ur: NEGATIVE mg/dL
Specific Gravity, Urine: 1.013 (ref 1.005–1.030)
pH: 5 (ref 5.0–8.0)

## 2021-08-26 LAB — TYPE AND SCREEN
ABO/RH(D): A POS
Antibody Screen: NEGATIVE

## 2021-08-26 LAB — CBC
HCT: 23.3 % — ABNORMAL LOW (ref 36.0–46.0)
Hemoglobin: 6.1 g/dL — ABNORMAL LOW (ref 12.0–15.0)
MCH: 17.2 pg — ABNORMAL LOW (ref 26.0–34.0)
MCHC: 26.2 g/dL — ABNORMAL LOW (ref 30.0–36.0)
MCV: 65.8 fL — ABNORMAL LOW (ref 80.0–100.0)
Platelets: 382 10*3/uL (ref 150–400)
RBC: 3.54 MIL/uL — ABNORMAL LOW (ref 3.87–5.11)
RDW: 21.3 % — ABNORMAL HIGH (ref 11.5–15.5)
WBC: 9.5 10*3/uL (ref 4.0–10.5)
nRBC: 0 % (ref 0.0–0.2)

## 2021-08-26 LAB — ABO/RH: ABO/RH(D): A POS

## 2021-08-26 LAB — LIPASE, BLOOD: Lipase: 27 U/L (ref 11–51)

## 2021-08-26 MED ORDER — ONDANSETRON 8 MG PO TBDP: 8.0000 mg | ORAL_TABLET | Freq: Three times a day (TID) | ORAL | 0 refills | Status: AC | PRN

## 2021-08-26 NOTE — ED Provider Notes (Signed)
[facility] Provider Note   Event Date/Time   First MD Initiated Contact with Patient 08/26/21 2100     (approximate) History  Abdominal Pain  HPI Krislynn Gronau is a 41 y.o. female  Location: Abdomen Duration: 24 hours prior to arrival Timing: Worsening since onset Severity: 8/10 Quality: Aching pain Context: Patient states she has been having intermittent 8/10 abdominal pain since yesterday.  Patient also is on her period Modifying factors: Denies.  States pain comes and goes Associated Symptoms: Vomiting, nausea, diarrhea ROS: Patient currently denies any vision changes, tinnitus, difficulty speaking, facial droop, sore throat, chest pain, shortness of breath, dysuria, or weakness/numbness/paresthesias in any extremity   Physical Exam  Triage Vital Signs: ED Triage Vitals  Enc Vitals Group     BP 08/26/21 1941 (!) 142/90     Pulse Rate 08/26/21 1941 95     Resp 08/26/21 1941 18     Temp 08/26/21 1942 99 F (37.2 C)     Temp Source 08/26/21 1942 Oral     SpO2 08/26/21 1941 96 %     Weight 08/26/21 1939 170 lb (77.1 kg)     Height 08/26/21 1939 5\' 2"  (1.575 m)     Head Circumference --      Peak Flow --      Pain Score 08/26/21 1939 8     Pain Loc --      Pain Edu? --      Excl. in [place]? --    Most recent vital signs: Vitals:   08/26/21 1941 08/26/21 1942  BP: (!) 142/90   Pulse: 95   Resp: 18   Temp:  99 F (37.2 C)  SpO2: 96%    General: Awake, oriented x4. CV:  Good peripheral perfusion.  Resp:  Normal effort.  Abd:  No distention.  Nontender to palpation Other:  Middle-aged African-American female laying in bed in no acute distress ED Results / Procedures / Treatments  Labs (all labs ordered are listed, but only abnormal results are displayed) Labs Reviewed  COMPREHENSIVE METABOLIC PANEL - Abnormal; Notable for the following components:      Result Value   Glucose, Bld 110 (*)    Calcium 8.7 (*)    Alkaline Phosphatase 32 (*)     All other components within normal limits  CBC - Abnormal; Notable for the following components:   RBC 3.54 (*)    Hemoglobin 6.1 (*)    HCT 23.3 (*)    MCV 65.8 (*)    MCH 17.2 (*)    MCHC 26.2 (*)    RDW 21.3 (*)    All other components within normal limits  URINALYSIS, ROUTINE W REFLEX MICROSCOPIC - Abnormal; Notable for the following components:   Color, Urine YELLOW (*)    APPearance HAZY (*)    Leukocytes,Ua TRACE (*)    Bacteria, UA RARE (*)    All other components within normal limits  LIPASE, BLOOD  TYPE AND SCREEN  ABO/RH   PROCEDURES: Critical Care performed: No .1-3 Lead EKG Interpretation  Performed by: 10/26/21, MD Authorized by: Merwyn Katos, MD     Interpretation: normal     ECG rate:  69   ECG rate assessment: normal     Rhythm: sinus rhythm     Ectopy: none     Conduction: normal    MEDICATIONS ORDERED IN ED: Medications - No data to display IMPRESSION / MDM / ASSESSMENT AND PLAN / ED  COURSE  I reviewed the triage vital signs and the nursing notes.                             The patient is on the cardiac monitor to evaluate for evidence of arrhythmia and/or significant heart rate changes. Patient's presentation is most consistent with acute presentation with potential threat to life or bodily function. Patient presents for acute nausea/vomiting The cause of the patients symptoms is not clear, but the patient is overall well appearing and is suspected to have a transient course of illness.  Given History and Exam there does not appear to be an emergent cause of the symptoms such as small bowel obstruction, coronary syndrome, bowel ischemia, DKA, pancreatitis, appendicitis, other acute abdomen or other emergent problem. Patient does show evidence of of anemia with hemoglobin of 6.1.  I discussed the patient's options with her including transfusion and discharge versus transfusion and admission.  Patient states that this happens fairly  frequently because she has heavy periods and does not wish to take any transfusions at this time.  Patient is hemodynamically stable and is showing no signs of orthostatic syncope at this time.  Therefore patient is stable for discharge and follow-up in our ER or with her OB/GYN if symptoms worsen. Disposition: Discharge home with prompt primary care physician follow up in the next 48 hours. Strict return precautions discussed. Clinical Course as of 08/26/21 2216  Fri Aug 26, 2021  2204 Pulse Rate: 95 [EB]    Clinical Course User Index [EB] Merwyn Katos, MD   FINAL CLINICAL IMPRESSION(S) / ED DIAGNOSES   Final diagnoses:  Generalized abdominal pain  Nausea vomiting and diarrhea  Anemia, unspecified type  Menorrhagia with regular cycle   Rx / DC Orders   ED Discharge Orders          Ordered    ondansetron (ZOFRAN-ODT) 8 MG disintegrating tablet  Every 8 hours PRN        08/26/21 2208           Note:  This document was prepared using Dragon voice recognition software and may include unintentional dictation errors.   Merwyn Katos, MD 08/27/21 2340

## 2021-08-26 NOTE — ED Notes (Signed)
Pt at the vending machine and she was advised to have nothing to eat or drink until after seen by MD

## 2021-08-26 NOTE — ED Triage Notes (Signed)
C/o of abdominal pain, vomiting, and diarrhea since yesterday.  States pain comes and goes. Thinks it might have been something she ate.

## 2022-04-12 ENCOUNTER — Other Ambulatory Visit: Payer: Self-pay

## 2022-04-12 ENCOUNTER — Emergency Department
Admission: EM | Admit: 2022-04-12 | Discharge: 2022-04-12 | Disposition: A | Discharge status: Home / Self Care | Payer: 59 | Attending: Emergency Medicine | Admitting: Emergency Medicine

## 2022-04-12 ENCOUNTER — Emergency Department: Payer: 59

## 2022-04-12 DIAGNOSIS — R1032 Left lower quadrant pain: Secondary | ICD-10-CM | POA: Insufficient documentation

## 2022-04-12 DIAGNOSIS — Z5321 Procedure and treatment not carried out due to patient leaving prior to being seen by health care provider: Secondary | ICD-10-CM | POA: Diagnosis not present

## 2022-04-12 DIAGNOSIS — R109 Unspecified abdominal pain: Secondary | ICD-10-CM | POA: Diagnosis not present

## 2022-04-12 DIAGNOSIS — R1110 Vomiting, unspecified: Secondary | ICD-10-CM | POA: Diagnosis not present

## 2022-04-12 DIAGNOSIS — N852 Hypertrophy of uterus: Secondary | ICD-10-CM | POA: Diagnosis not present

## 2022-04-12 LAB — COMPREHENSIVE METABOLIC PANEL
ALT: 11 U/L (ref 0–44)
AST: 16 U/L (ref 15–41)
Albumin: 3.8 g/dL (ref 3.5–5.0)
Alkaline Phosphatase: 38 U/L (ref 38–126)
Anion gap: 7 (ref 5–15)
BUN: 6 mg/dL (ref 6–20)
CO2: 23 mmol/L (ref 22–32)
Calcium: 9 mg/dL (ref 8.9–10.3)
Chloride: 108 mmol/L (ref 98–111)
Creatinine, Ser: 0.74 mg/dL (ref 0.44–1.00)
GFR, Estimated: >60 mL/min (ref >60)
Glucose, Bld: 109 mg/dL — ABNORMAL HIGH (ref 70–99)
Potassium: 3.7 mmol/L (ref 3.5–5.1)
Sodium: 138 mmol/L (ref 135–145)
Total Bilirubin: 0.4 mg/dL (ref 0.3–1.2)
Total Protein: 7.4 g/dL (ref 6.5–8.1)

## 2022-04-12 LAB — URINALYSIS, ROUTINE W REFLEX MICROSCOPIC
Bilirubin Urine: NEGATIVE
Glucose, UA: NEGATIVE mg/dL
Ketones, ur: NEGATIVE mg/dL
Nitrite: NEGATIVE
Protein, ur: 100 mg/dL — AB
RBC / HPF: >50 RBC/hpf — ABNORMAL HIGH (ref 0–5)
Specific Gravity, Urine: 1.012 (ref 1.005–1.030)
WBC, UA: >50 WBC/hpf — ABNORMAL HIGH (ref 0–5)
pH: 8 (ref 5.0–8.0)

## 2022-04-12 LAB — CBC
HCT: 31.6 % — ABNORMAL LOW (ref 36.0–46.0)
Hemoglobin: 8.6 g/dL — ABNORMAL LOW (ref 12.0–15.0)
MCH: 20 pg — ABNORMAL LOW (ref 26.0–34.0)
MCHC: 27.2 g/dL — ABNORMAL LOW (ref 30.0–36.0)
MCV: 73.5 fL — ABNORMAL LOW (ref 80.0–100.0)
Platelets: 352 10*3/uL (ref 150–400)
RBC: 4.3 MIL/uL (ref 3.87–5.11)
RDW: 25.3 % — ABNORMAL HIGH (ref 11.5–15.5)
WBC: 11.4 10*3/uL — ABNORMAL HIGH (ref 4.0–10.5)
nRBC: 0 % (ref 0.0–0.2)

## 2022-04-12 LAB — POC URINE PREG, ED: Preg Test, Ur: NEGATIVE

## 2022-04-12 LAB — LIPASE, BLOOD: Lipase: 35 U/L (ref 11–51)

## 2022-04-12 NOTE — ED Provider Triage Note (Signed)
Emergency Medicine Provider Triage Evaluation Note  Julyanna Allen , a 42 y.o. female  was evaluated in triage.  Pt complains of suprapubic and left lower quadrant abdominal pain that radiates into her back.  She reports that this started today.  She reports that she has had several episodes of vomiting.  No fevers or chills.  Pressure with urination, though no pain with urination.  No hematuria.  No vaginal discharge.  Review of Systems  Positive: Abd pain, vomiting Negative: Fever, diarrhea  Physical Exam  BP (!) 176/89   Pulse 60   Temp 99.2 F (37.3 C)   Resp 18   SpO2 98%  Gen:   Awake, no distress   Resp:  Normal effort  MSK:   Moves extremities without difficulty  Other:    Medical Decision Making  Medically screening exam initiated at 1:17 PM.  Appropriate orders placed.  Cyan Clippinger was informed that the remainder of the evaluation will be completed by another provider, this initial triage assessment does not replace that evaluation, and the importance of remaining in the ED until their evaluation is complete.     Marquette Old, PA-C 04/12/22 1319

## 2022-04-12 NOTE — ED Triage Notes (Signed)
Pt comes with c/o abdominal pain. Pt states some kidney pain. Pt states pressure on bladder and back. Pt states she woke up like this today.

## 2022-04-24 ENCOUNTER — Ambulatory Visit: Payer: Medicaid Other

## 2022-06-08 ENCOUNTER — Emergency Department: Payer: 59

## 2022-06-08 ENCOUNTER — Emergency Department
Admission: EM | Admit: 2022-06-08 | Discharge: 2022-06-08 | Disposition: A | Discharge status: Home / Self Care | Payer: 59 | Attending: Emergency Medicine | Admitting: Emergency Medicine

## 2022-06-08 DIAGNOSIS — R509 Fever, unspecified: Secondary | ICD-10-CM | POA: Insufficient documentation

## 2022-06-08 DIAGNOSIS — M791 Myalgia, unspecified site: Secondary | ICD-10-CM | POA: Diagnosis not present

## 2022-06-08 DIAGNOSIS — N3000 Acute cystitis without hematuria: Secondary | ICD-10-CM

## 2022-06-08 DIAGNOSIS — Z1152 Encounter for screening for COVID-19: Secondary | ICD-10-CM | POA: Diagnosis not present

## 2022-06-08 LAB — RETICULOCYTES
Immature Retic Fract: 11.9 % (ref 2.3–15.9)
RBC.: 4.23 MIL/uL (ref 3.87–5.11)
Retic Count, Absolute: 43.1 10*3/uL (ref 19.0–186.0)
Retic Ct Pct: 1 % (ref 0.4–3.1)

## 2022-06-08 LAB — URINALYSIS, ROUTINE W REFLEX MICROSCOPIC
Bilirubin Urine: NEGATIVE
Glucose, UA: NEGATIVE mg/dL
Nitrite: POSITIVE — AB
Protein, ur: 100 mg/dL — AB
Specific Gravity, Urine: 1.015 (ref 1.005–1.030)
pH: 5.5 (ref 5.0–8.0)

## 2022-06-08 LAB — COMPREHENSIVE METABOLIC PANEL
ALT: 13 U/L (ref 0–44)
AST: 17 U/L (ref 15–41)
Albumin: 3.6 g/dL (ref 3.5–5.0)
Alkaline Phosphatase: 44 U/L (ref 38–126)
Anion gap: 11 (ref 5–15)
BUN: 8 mg/dL (ref 6–20)
CO2: 19 mmol/L — ABNORMAL LOW (ref 22–32)
Calcium: 8.8 mg/dL — ABNORMAL LOW (ref 8.9–10.3)
Chloride: 104 mmol/L (ref 98–111)
Creatinine, Ser: 0.74 mg/dL (ref 0.44–1.00)
GFR, Estimated: >60 mL/min (ref >60)
Glucose, Bld: 89 mg/dL (ref 70–99)
Potassium: 3 mmol/L — ABNORMAL LOW (ref 3.5–5.1)
Sodium: 134 mmol/L — ABNORMAL LOW (ref 135–145)
Total Bilirubin: 0.6 mg/dL (ref 0.3–1.2)
Total Protein: 7.5 g/dL (ref 6.5–8.1)

## 2022-06-08 LAB — RESP PANEL BY RT-PCR (RSV, FLU A&B, COVID)  RVPGX2
Influenza A by PCR: NEGATIVE
Influenza B by PCR: NEGATIVE
Resp Syncytial Virus by PCR: NEGATIVE
SARS Coronavirus 2 by RT PCR: NEGATIVE

## 2022-06-08 LAB — CBC WITH DIFFERENTIAL/PLATELET
Abs Immature Granulocytes: 0.09 10*3/uL — ABNORMAL HIGH (ref 0.00–0.07)
Basophils Absolute: 0 10*3/uL (ref 0.0–0.1)
Basophils Relative: 0 %
Eosinophils Absolute: 0 10*3/uL (ref 0.0–0.5)
Eosinophils Relative: 0 %
HCT: 30.2 % — ABNORMAL LOW (ref 36.0–46.0)
Hemoglobin: 9 g/dL — ABNORMAL LOW (ref 12.0–15.0)
Immature Granulocytes: 1 %
Lymphocytes Relative: 6 %
Lymphs Abs: 1 10*3/uL (ref 0.7–4.0)
MCH: 21.7 pg — ABNORMAL LOW (ref 26.0–34.0)
MCHC: 29.8 g/dL — ABNORMAL LOW (ref 30.0–36.0)
MCV: 72.8 fL — ABNORMAL LOW (ref 80.0–100.0)
Monocytes Absolute: 1.4 10*3/uL — ABNORMAL HIGH (ref 0.1–1.0)
Monocytes Relative: 9 %
Neutro Abs: 13.1 10*3/uL — ABNORMAL HIGH (ref 1.7–7.7)
Neutrophils Relative %: 84 %
Platelets: 229 10*3/uL (ref 150–400)
RBC: 4.15 MIL/uL (ref 3.87–5.11)
RDW: 19.8 % — ABNORMAL HIGH (ref 11.5–15.5)
WBC: 15.6 10*3/uL — ABNORMAL HIGH (ref 4.0–10.5)
nRBC: 0 % (ref 0.0–0.2)

## 2022-06-08 LAB — LACTIC ACID, PLASMA: Lactic Acid, Venous: 1 mmol/L (ref 0.5–1.9)

## 2022-06-08 LAB — SEDIMENTATION RATE: Sed Rate: 63 mm/hr — ABNORMAL HIGH (ref 0–20)

## 2022-06-08 MED ORDER — ACETAMINOPHEN 325 MG PO TABS
650.0000 mg | ORAL_TABLET | Freq: Once | ORAL | Status: AC | PRN
  Administered 2022-06-08: 650 mg via ORAL
  Filled 2022-06-08: qty 2

## 2022-06-08 MED ORDER — PHENAZOPYRIDINE HCL 100 MG PO TABS: 100.0000 mg | ORAL_TABLET | Freq: Three times a day (TID) | ORAL | 0 refills | Status: AC | PRN

## 2022-06-08 MED ORDER — SODIUM CHLORIDE 0.9 % IV BOLUS
1000.0000 mL | Freq: Once | INTRAVENOUS | Status: AC
  Administered 2022-06-08: 1000 mL via INTRAVENOUS

## 2022-06-08 MED ORDER — SODIUM CHLORIDE 0.9 % IV SOLN
1.0000 g | Freq: Once | INTRAVENOUS | Status: AC
  Administered 2022-06-08: 1 g via INTRAVENOUS
  Filled 2022-06-08: qty 10

## 2022-06-08 MED ORDER — SULFAMETHOXAZOLE-TRIMETHOPRIM 800-160 MG PO TABS: 1.0000 | ORAL_TABLET | Freq: Two times a day (BID) | ORAL | 0 refills | Status: AC

## 2022-06-08 NOTE — Discharge Instructions (Signed)
Please take the antibiotic as prescribed until finished.  Call and schedule follow-up appointment with your primary care provider in about 10 to 14 days for repeat urinalysis to ensure it has completely cleared.  Return to the emergency department if any symptom changes or worsens and you are unable to see primary care.

## 2022-06-08 NOTE — ED Triage Notes (Signed)
Pt sts that she has been having fever and body aches. Pt sts that she tried OTC meds for the fever and body pain but it did not help.

## 2022-06-08 NOTE — ED Provider Notes (Signed)
[facility] Provider Note    Event Date/Time   First MD Initiated Contact with Patient 06/08/22 1752     (approximate)   History   Fever   HPI  Laura Williams is a 42 y.o. female with history of sickle cell presents emergency department with fever, chills and bodyaches.  Symptoms started 2 days ago.  She denies any chest pain or shortness of breath.  No vomiting or diarrhea.     Physical Exam   Triage Vital Signs: ED Triage Vitals  Enc Vitals Group     BP 06/08/22 1730 128/86     Pulse Rate 06/08/22 1730 (!) 110     Resp 06/08/22 1730 17     Temp 06/08/22 1730 (!) 103.2 F (39.6 C)     Temp Source 06/08/22 1730 Oral     SpO2 06/08/22 1730 98 %     Weight 06/08/22 1731 168 lb (76.2 kg)     Height --      Head Circumference --      Peak Flow --      Pain Score 06/08/22 1731 10     Pain Loc --      Pain Edu? --      Excl. in [place]? --     Most recent vital signs: Vitals:   06/08/22 1819 06/08/22 1949  BP:  (!) 113/59  Pulse:  86  Resp:  17  Temp: (!) 101.3 F (38.5 C) 98.9 F (37.2 C)  SpO2:  98%     General: Awake, no distress.   CV:  Good peripheral perfusion. regular rate and  rhythm Resp:  Normal effort. Lungs cta Abd:  No distention.   Other:      ED Results / Procedures / Treatments   Labs (all labs ordered are listed, but only abnormal results are displayed) Labs Reviewed  RESP PANEL BY RT-PCR (RSV, FLU A&B, COVID)  [id number]  URINALYSIS, ROUTINE W REFLEX MICROSCOPIC  CBC WITH DIFFERENTIAL/PLATELET  COMPREHENSIVE METABOLIC PANEL  RETICULOCYTES  SEDIMENTATION RATE     EKG     RADIOLOGY chest x-ray    PROCEDURES:   Procedures   MEDICATIONS ORDERED IN ED: Medications  sodium chloride 0.9 % bolus 1,000 mL (has no administration in time range)  acetaminophen (TYLENOL) tablet 650 mg (650 mg Oral Given 06/08/22 1734)     IMPRESSION / MDM / ASSESSMENT AND PLAN / ED COURSE  I reviewed the triage vital  signs and the nursing notes.                              Differential diagnosis includes, but is not limited to, COVID, influenza, CAP, UTI, sickle cell crisis  Patient's presentation is most consistent with acute presentation with potential threat to life or bodily function.   Respiratory panel is reassuring Chest x-ray and UA ordered  Chest x-ray independently reviewed interpreted by me as being negative for any acute abnormality  Due to the patient's history of sickle cell will start fluids, do blood work, care being transferred to Charlsie Merles, NP      FINAL CLINICAL IMPRESSION(S) / ED DIAGNOSES   Final diagnoses:  Fever in adult     Rx / DC Orders   ED Discharge Orders     None        Note:  This document was prepared using Dragon voice recognition software and may include unintentional dictation  errors.    Versie Starks, PA-C 06/08/22 1952    Carrie Mew, MD 06/09/22 2001

## 2022-06-08 NOTE — ED Provider Notes (Signed)
-----------------------------------------   7:50 PM on 06/08/2022 -----------------------------------------  Blood pressure (!) 113/59, pulse 86, temperature 98.9 F (37.2 C), temperature source Oral, resp. rate 17, weight 76.2 kg, last menstrual period 04/29/2022, SpO2 98 %.  Assuming care from Ashok Cordia, PA-C.  In short, Laura Williams is a 42 y.o. female with a chief complaint of Fever .  Refer to the original H&P for additional details.  The current plan of care is to await results of labs to ensure she is not in sickle cell crisis. She does not feel her current symptoms are related.   ----------------------------------------- 8:00 PM on 06/08/2022 -----------------------------------------  Urinalysis consistent with acute cystitis. She now reports urinary symptoms for the past couple of weeks. Tachycardiac and febrile on arrival. Will add lactic and rocephin. Fluids are already infusing.   Prior to discharge, patient reports feeling "much better."  She was strongly encouraged to follow-up with her primary care provider for repeat urinalysis.  She was also advised to take the antibiotic as prescribed until finished.  If she develops any additional symptoms of concern or fever she is to return to the emergency department if unable to see primary care.   Victorino Dike, FNP 06/08/22 2234    Carrie Mew, MD 06/09/22 2001
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
